# Patient Record
Sex: Female | Born: 1965 | Race: Black or African American | Hispanic: No | Marital: Single | Smoking: Never smoker
Health system: Southern US, Community
[De-identification: ages and names within clinical notes are randomized; demographics above are authoritative.]

## PROBLEM LIST (undated history)

## (undated) DIAGNOSIS — D649 Anemia, unspecified: Secondary | ICD-10-CM

## (undated) DIAGNOSIS — F32A Depression, unspecified: Secondary | ICD-10-CM

## (undated) DIAGNOSIS — K5792 Diverticulitis of intestine, part unspecified, without perforation or abscess without bleeding: Secondary | ICD-10-CM

## (undated) DIAGNOSIS — I10 Essential (primary) hypertension: Secondary | ICD-10-CM

## (undated) DIAGNOSIS — T8859XA Other complications of anesthesia, initial encounter: Secondary | ICD-10-CM

## (undated) DIAGNOSIS — E119 Type 2 diabetes mellitus without complications: Secondary | ICD-10-CM

## (undated) HISTORY — PX: HYSTEROSCOPY WITH D & C: SHX1775

---

## 2006-01-03 ENCOUNTER — Emergency Department: Payer: Self-pay | Admitting: Emergency Medicine

## 2006-07-15 ENCOUNTER — Ambulatory Visit: Payer: Self-pay

## 2006-08-03 ENCOUNTER — Ambulatory Visit: Payer: Self-pay

## 2006-11-27 ENCOUNTER — Ambulatory Visit: Payer: Self-pay | Admitting: Certified Nurse Midwife

## 2006-12-03 ENCOUNTER — Ambulatory Visit: Payer: Self-pay | Admitting: Certified Nurse Midwife

## 2007-10-22 ENCOUNTER — Ambulatory Visit: Payer: Self-pay | Admitting: Family Medicine

## 2010-12-18 ENCOUNTER — Ambulatory Visit: Payer: Self-pay | Admitting: Family Medicine

## 2011-10-01 ENCOUNTER — Ambulatory Visit: Payer: Self-pay | Admitting: Family Medicine

## 2012-01-15 ENCOUNTER — Emergency Department: Payer: Self-pay | Admitting: Emergency Medicine

## 2012-01-15 LAB — URINALYSIS, COMPLETE
Bacteria: NONE SEEN
Bilirubin,UR: NEGATIVE
Blood: NEGATIVE
Glucose,UR: NEGATIVE mg/dL (ref 0–75)
Ketone: NEGATIVE
Nitrite: NEGATIVE
Ph: 5 (ref 4.5–8.0)
Protein: NEGATIVE
RBC,UR: <1 /HPF (ref 0–5)
Specific Gravity: 1.019 (ref 1.003–1.030)
Squamous Epithelial: 2
WBC UR: 1 /HPF (ref 0–5)

## 2012-01-15 LAB — PREGNANCY, URINE: Pregnancy Test, Urine: NEGATIVE m[IU]/mL

## 2012-01-15 LAB — WET PREP, GENITAL

## 2013-11-14 ENCOUNTER — Ambulatory Visit: Payer: Self-pay | Admitting: Internal Medicine

## 2013-12-14 ENCOUNTER — Ambulatory Visit: Payer: Self-pay | Admitting: Family Medicine

## 2013-12-20 ENCOUNTER — Ambulatory Visit: Payer: Self-pay | Admitting: Family Medicine

## 2014-01-02 ENCOUNTER — Ambulatory Visit: Payer: Self-pay | Admitting: Family Medicine

## 2014-02-01 ENCOUNTER — Ambulatory Visit: Payer: Self-pay | Admitting: Family Medicine

## 2014-06-06 NOTE — Patient Outreach (Deleted)
   06/06/2014  [place] [address]  Dear Anne Fu, Kenney Houseman,  My records indicate that your last Link to Wellness appointment was on 05/03/14.  Per program requirements you must meet at least every three months with a member of the care team unless other arrangements have been made with your chronic care coordinator.  In order to avoid cancellation of programs and benefits including reduced co-pays on qualifying medications and supplies, please call [phone] by June 21, 2014 to schedule an appointment.    Once cancellation and termination of your Link To Wellness Program benefits has occurred, you must wait three (3) months before you are eligible for renewal.  If you feel that you have received this letter in error, please contact me at [phone] or e-mail at [email or web address].  Sincerely,   [facility] [place], [place], [place]  [address] Phone:  [phone] - Fax: [phone]

## 2014-06-06 NOTE — Patient Outreach (Signed)
   06/06/2014  [place] [address]  Dear Anne Fu, Kenney Houseman,  My records indicate that your last Link to Wellness appointment was on 05/03/14.  Per program requirements you must meet at least every three months with a member of the care team unless other arrangements have been made with your chronic care coordinator.  In order to avoid cancellation of programs and benefits including reduced co-pays on qualifying medications and supplies, please call [phone] by June 21, 2014 to schedule an appointment.    Once cancellation and termination of your Link To Wellness Program benefits has occurred, you must wait three (3) months before you are eligible for renewal.  If you feel that you have received this letter in error, please contact me at [phone] or e-mail at [email or web address].  Sincerely,   [facility] [place], [place], [place]  [address] Phone:  [phone] - Fax: [phone]

## 2015-06-20 ENCOUNTER — Encounter: Payer: Self-pay | Admitting: *Deleted

## 2015-06-20 ENCOUNTER — Ambulatory Visit (INDEPENDENT_AMBULATORY_CARE_PROVIDER_SITE_OTHER): Payer: 59

## 2015-06-20 ENCOUNTER — Ambulatory Visit
Admission: EM | Admit: 2015-06-20 | Discharge: 2015-06-20 | Disposition: A | Discharge status: Home / Self Care | Payer: 59 | Attending: Family Medicine | Admitting: Family Medicine

## 2015-06-20 DIAGNOSIS — S161XXA Strain of muscle, fascia and tendon at neck level, initial encounter: Secondary | ICD-10-CM | POA: Diagnosis not present

## 2015-06-20 DIAGNOSIS — M25512 Pain in left shoulder: Secondary | ICD-10-CM | POA: Diagnosis not present

## 2015-06-20 MED ORDER — METAXALONE 800 MG PO TABS: 800.0000 mg | ORAL_TABLET | Freq: Three times a day (TID) | ORAL | Status: DC

## 2015-06-20 MED ORDER — NAPROXEN 500 MG PO TABS: 500.0000 mg | ORAL_TABLET | Freq: Two times a day (BID) | ORAL | Status: DC
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
## 2015-06-20 NOTE — Discharge Instructions (Signed)
Cervical Sprain  A cervical sprain is an injury in the neck in which the strong, fibrous tissues (ligaments) that connect your neck bones stretch or tear. Cervical sprains can range from mild to severe. Severe cervical sprains can cause the neck vertebrae to be unstable. This can lead to damage of the spinal cord and can result in serious nervous system problems. The amount of time it takes for a cervical sprain to get better depends on the cause and extent of the injury. Most cervical sprains heal in 1 to 3 weeks.  CAUSES   Severe cervical sprains may be caused by:    Contact sport injuries (such as from football, rugby, wrestling, hockey, auto racing, gymnastics, diving, martial arts, or boxing).    Motor vehicle collisions.    Whiplash injuries. This is an injury from a sudden forward and backward whipping movement of the head and neck.   Falls.   Mild cervical sprains may be caused by:    Being in an awkward position, such as while cradling a telephone between your ear and shoulder.    Sitting in a chair that does not offer proper support.    Working at a poorly designed computer station.    Looking up or down for long periods of time.   SYMPTOMS    Pain, soreness, stiffness, or a burning sensation in the front, back, or sides of the neck. This discomfort may develop immediately after the injury or slowly, 24 hours or more after the injury.    Pain or tenderness directly in the middle of the back of the neck.    Shoulder or upper back pain.    Limited ability to move the neck.    Headache.    Dizziness.    Weakness, numbness, or tingling in the hands or arms.    Muscle spasms.    Difficulty swallowing or chewing.    Tenderness and swelling of the neck.   DIAGNOSIS   Most of the time your health care provider can diagnose a cervical sprain by taking your history and doing a physical exam. Your health care provider will ask about previous neck injuries and any known neck  problems, such as arthritis in the neck. X-rays may be taken to find out if there are any other problems, such as with the bones of the neck. Other tests, such as a CT scan or MRI, may also be needed.   TREATMENT   Treatment depends on the severity of the cervical sprain. Mild sprains can be treated with rest, keeping the neck in place (immobilization), and pain medicines. Severe cervical sprains are immediately immobilized. Further treatment is done to help with pain, muscle spasms, and other symptoms and may include:   Medicines, such as pain relievers, numbing medicines, or muscle relaxants.    Physical therapy. This may involve stretching exercises, strengthening exercises, and posture training. Exercises and improved posture can help stabilize the neck, strengthen muscles, and help stop symptoms from returning.   HOME CARE INSTRUCTIONS    Put ice on the injured area.     Put ice in a plastic bag.     Place a towel between your skin and the bag.     Leave the ice on for 15-20 minutes, 3-4 times a day.    If your injury was severe, you may have been given a cervical collar to wear. A cervical collar is a two-piece collar designed to keep your neck from moving while it heals.      Do not remove the collar unless instructed by your health care provider.    If you have long hair, keep it outside of the collar.    Ask your health care provider before making any adjustments to your collar. Minor adjustments may be required over time to improve comfort and reduce pressure on your chin or on the back of your head.    Ifyou are allowed to remove the collar for cleaning or bathing, follow your health care provider's instructions on how to do so safely.    Keep your collar clean by wiping it with mild soap and water and drying it completely. If the collar you have been given includes removable pads, remove them every 1-2 days and hand wash them with soap and water. Allow them to air dry. They should be completely  dry before you wear them in the collar.    If you are allowed to remove the collar for cleaning and bathing, wash and dry the skin of your neck. Check your skin for irritation or sores. If you see any, tell your health care provider.    Do not drive while wearing the collar.    Only take over-the-counter or prescription medicines for pain, discomfort, or fever as directed by your health care provider.    Keep all follow-up appointments as directed by your health care provider.    Keep all physical therapy appointments as directed by your health care provider.    Make any needed adjustments to your workstation to promote good posture.    Avoid positions and activities that make your symptoms worse.    Warm up and stretch before being active to help prevent problems.   SEEK MEDICAL CARE IF:    Your pain is not controlled with medicine.    You are unable to decrease your pain medicine over time as planned.    Your activity level is not improving as expected.   SEEK IMMEDIATE MEDICAL CARE IF:    You develop any bleeding.   You develop stomach upset.   You have signs of an allergic reaction to your medicine.    Your symptoms get worse.    You develop new, unexplained symptoms.    You have numbness, tingling, weakness, or paralysis in any part of your body.   MAKE SURE YOU:    Understand these instructions.   Will watch your condition.   Will get help right away if you are not doing well or get worse.     This information is not intended to replace advice given to you by your health care provider. Make sure you discuss any questions you have with your health care provider.     Document Released: 12/16/2006 Document Revised: 02/23/2013 Document Reviewed: 08/26/2012  Elsevier Interactive Patient Education 2016 [employer]
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
## 2017-01-11 ENCOUNTER — Encounter: Payer: Self-pay | Admitting: Gynecology

## 2017-01-11 ENCOUNTER — Ambulatory Visit
Admission: EM | Admit: 2017-01-11 | Discharge: 2017-01-11 | Disposition: A | Discharge status: Home / Self Care | Payer: Medicaid Other | Attending: Family Medicine | Admitting: Family Medicine

## 2017-01-11 ENCOUNTER — Other Ambulatory Visit: Payer: Self-pay

## 2017-01-11 DIAGNOSIS — H5712 Ocular pain, left eye: Secondary | ICD-10-CM

## 2017-01-11 DIAGNOSIS — R51 Headache: Secondary | ICD-10-CM

## 2017-01-11 DIAGNOSIS — M545 Low back pain: Secondary | ICD-10-CM

## 2017-01-11 MED ORDER — MELOXICAM 15 MG PO TABS: 15.0000 mg | ORAL_TABLET | Freq: Every day | ORAL | 0 refills | Status: DC | PRN

## 2017-01-11 MED ORDER — CYCLOBENZAPRINE HCL 10 MG PO TABS: 10.0000 mg | ORAL_TABLET | Freq: Three times a day (TID) | ORAL | 0 refills | Status: DC | PRN

## 2017-01-11 NOTE — Discharge Instructions (Signed)
Medications as prescribed. ° °Rest. ° °Take care ° °Dr. Marrion Finan  °
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
## 2017-03-29 ENCOUNTER — Other Ambulatory Visit: Payer: Self-pay

## 2017-03-29 ENCOUNTER — Ambulatory Visit
Admission: EM | Admit: 2017-03-29 | Discharge: 2017-03-29 | Disposition: A | Discharge status: Home / Self Care | Payer: BLUE CROSS/BLUE SHIELD | Attending: Family Medicine | Admitting: Family Medicine

## 2017-03-29 DIAGNOSIS — R1032 Left lower quadrant pain: Secondary | ICD-10-CM

## 2017-03-29 HISTORY — DX: Morbid (severe) obesity due to excess calories: E66.01

## 2017-03-29 LAB — URINALYSIS, COMPLETE (UACMP) WITH MICROSCOPIC
Bilirubin Urine: NEGATIVE
Glucose, UA: NEGATIVE mg/dL
Hgb urine dipstick: NEGATIVE
Leukocytes, UA: NEGATIVE
Nitrite: NEGATIVE
Protein, ur: NEGATIVE mg/dL
Specific Gravity, Urine: 1.025 (ref 1.005–1.030)
pH: 5.5 (ref 5.0–8.0)

## 2017-03-29 MED ORDER — CIPROFLOXACIN HCL 500 MG PO TABS: 500.0000 mg | ORAL_TABLET | Freq: Two times a day (BID) | ORAL | 0 refills | Status: DC

## 2017-03-29 MED ORDER — METRONIDAZOLE 500 MG PO TABS: 500.0000 mg | ORAL_TABLET | Freq: Three times a day (TID) | ORAL | 0 refills | Status: DC

## 2017-03-29 NOTE — Discharge Instructions (Signed)
I suspect that you have diverticulitis.  I have started you on treatment - 2 antibiotics.  If no improvement you will need labs and imaging.  Take care  Dr. Lacinda Axon

## 2017-03-29 NOTE — ED Provider Notes (Signed)
[facility]    CSN: [id number] Arrival date & time: 03/29/17  0855   History   Chief Complaint Chief Complaint  Patient presents with  . Pelvic Pain   HPI   52 year old female presents with left lower quadrant pain.  Started on Thursday.  Patient thought that this is related to gas and took some Gas-X with some improvement.  She has had a large bowel movement.  No urinary frequency, urgency, or dysuria.  However she states that her urine has been dark and "cloudy".  Patient would like her urine tested today.  Patient states she continues to have left lower quadrant abdominal pain.  Described as achy.  She has had some improvement with Gas-X.  No other relieving factors.  No known exacerbating factors.  No other complaints or concerns at this time.  Social History Social History   Tobacco Use  . Smoking status: Never Smoker  . Smokeless tobacco: Never Used  Substance Use Topics  . Alcohol use: Yes    Comment: social  . Drug use: No     Allergies   Patient has no known allergies.   Review of Systems Review of Systems  Constitutional: Negative.   Gastrointestinal: Positive for abdominal pain.  Genitourinary: Negative.    Physical Exam Triage Vital Signs ED Triage Vitals  Enc Vitals Group     BP 03/29/17 0924 (!) 144/82     Pulse Rate 03/29/17 0924 79     Resp 03/29/17 0924 18     Temp 03/29/17 0924 97.6 F (36.4 C)     Temp Source 03/29/17 0924 Oral     SpO2 03/29/17 0924 98 %     Weight 03/29/17 0926 (!) 338 lb (153.3 kg)     Height 03/29/17 0926 5\' 9"  (1.753 m)     Head Circumference --      Peak Flow --      Pain Score 03/29/17 0925 1     Pain Loc --      Pain Edu? --      Excl. in [place]? --    Updated Vital Signs BP (!) 144/82 (BP Location: Left Arm)   Pulse 79   Temp 97.6 F (36.4 C) (Oral)   Resp 18   Ht 5\' 9"  (1.753 m)   Wt (!) 338 lb (153.3 kg)   LMP 12/27/2016   SpO2 98%   BMI 49.91 kg/m      Physical Exam  Constitutional:  She is oriented to person, place, and time. She appears well-developed and well-nourished. No distress.  Cardiovascular: Normal rate and regular rhythm.  Pulmonary/Chest: Effort normal and breath sounds normal. She has no wheezes. She has no rales.  Abdominal:  Obese.  Soft, nondistended.  Exquisite tenderness to palpation in the left lower quadrant.  Neurological: She is alert and oriented to person, place, and time.  Psychiatric: She has a normal mood and affect. Her behavior is normal.  Nursing note and vitals reviewed.  UC Treatments / Results  Labs (all labs ordered are listed, but only abnormal results are displayed) Labs Reviewed  URINALYSIS, COMPLETE (UACMP) WITH MICROSCOPIC - Abnormal; Notable for the following components:      Result Value   APPearance HAZY (*)    Ketones, ur TRACE (*)    Squamous Epithelial / LPF 6-30 (*)    Bacteria, UA FEW (*)    All other components within normal limits    EKG  EKG Interpretation None  Radiology No results found.  Procedures Procedures (including critical care time)  Medications Ordered in UC Medications - No data to display   Initial Impression / Assessment and Plan / UC Course  I have reviewed the triage vital signs and the nursing notes.  Pertinent labs & imaging results that were available during my care of the patient were reviewed by me and considered in my medical decision making (see chart for details).     52 year old female presents with left lower quadrant pain.  Most likely has diverticulitis.  Treating with Cipro and Flagyl.  Final Clinical Impressions(s) / UC Diagnoses   Final diagnoses:  LLQ pain    ED Discharge Orders        Ordered    ciprofloxacin (CIPRO) 500 MG tablet  2 times daily     03/29/17 1003    metroNIDAZOLE (FLAGYL) 500 MG tablet  3 times daily     03/29/17 1003     Controlled Substance Prescriptions [place] Controlled Substance Registry consulted? Not Applicable   Coral Spikes, DO 03/29/17 1016

## 2017-03-29 NOTE — ED Triage Notes (Signed)
Pt with 2 days of left pelvic pain. "I though it was gas and took GAS X with some relief." Denies urinary sx but asking for urine test.

## 2017-04-01 ENCOUNTER — Telehealth: Payer: Self-pay | Admitting: *Deleted

## 2017-04-01 NOTE — Telephone Encounter (Signed)
Called patient, no answer, left message advising patient to follow up with PCP if symptoms persist or become worse.

## 2017-04-10 ENCOUNTER — Encounter: Payer: Self-pay | Admitting: *Deleted

## 2017-04-10 ENCOUNTER — Ambulatory Visit
Admission: EM | Admit: 2017-04-10 | Discharge: 2017-04-10 | Disposition: A | Discharge status: Home / Self Care | Payer: BLUE CROSS/BLUE SHIELD | Attending: Family Medicine | Admitting: Family Medicine

## 2017-04-10 ENCOUNTER — Other Ambulatory Visit: Payer: Self-pay

## 2017-04-10 DIAGNOSIS — R0981 Nasal congestion: Secondary | ICD-10-CM | POA: Diagnosis not present

## 2017-04-10 DIAGNOSIS — R509 Fever, unspecified: Secondary | ICD-10-CM | POA: Diagnosis not present

## 2017-04-10 DIAGNOSIS — R69 Illness, unspecified: Secondary | ICD-10-CM

## 2017-04-10 DIAGNOSIS — M791 Myalgia, unspecified site: Secondary | ICD-10-CM

## 2017-04-10 DIAGNOSIS — J111 Influenza due to unidentified influenza virus with other respiratory manifestations: Secondary | ICD-10-CM

## 2017-04-10 LAB — RAPID STREP SCREEN (MED CTR MEBANE ONLY): Streptococcus, Group A Screen (Direct): NEGATIVE

## 2017-04-10 MED ORDER — OSELTAMIVIR PHOSPHATE 75 MG PO CAPS: 75.0000 mg | ORAL_CAPSULE | Freq: Two times a day (BID) | ORAL | 0 refills | Status: DC

## 2017-04-10 NOTE — Discharge Instructions (Signed)
Tamiflu as prescribed.  Rest, fluids.  Take care  Dr. Lacinda Axon

## 2017-04-10 NOTE — ED Provider Notes (Signed)
[facility]    CSN: [id number] Arrival date & time: 04/10/17  0946  History   Chief Complaint Chief Complaint  Patient presents with  . Nasal Congestion  . Fever  . Generalized Body Aches   HPI  52 year old female presents with the above complaints.  Patient reports a 2-day history of cough, congestion, body aches, subjective fever and chills.  She has had sick contacts that she works in [employer].  She has had some relief with over-the-counter treatment.  However, she still feels poorly.  Still having significant body aches.  No known exacerbating factors.  No other reported symptoms.  No other complaints at this time.  Social History Social History   Tobacco Use  . Smoking status: Never Smoker  . Smokeless tobacco: Never Used  Substance Use Topics  . Alcohol use: Yes    Comment: social  . Drug use: No     Allergies   Patient has no known allergies.   Review of Systems Review of Systems  Constitutional: Positive for chills and fever.  HENT: Positive for congestion.   Respiratory: Positive for cough.   Musculoskeletal:       Body aches.   Physical Exam Triage Vital Signs ED Triage Vitals  Enc Vitals Group     BP 04/10/17 1000 (!) 146/72     Pulse Rate 04/10/17 1000 82     Resp 04/10/17 1000 18     Temp 04/10/17 1000 98.9 F (37.2 C)     Temp Source 04/10/17 1000 Oral     SpO2 04/10/17 1000 99 %     Weight --      Height --      Head Circumference --      Peak Flow --      Pain Score 04/10/17 1001 0     Pain Loc --      Pain Edu? --      Excl. in [place]? --    Updated Vital Signs BP (!) 146/72 (BP Location: Left Arm)   Pulse 82   Temp 98.9 F (37.2 C) (Oral)   Resp 18   SpO2 99%   Physical Exam  Constitutional: She is oriented to person, place, and time. She appears well-developed and well-nourished. No distress.  HENT:  Head: Normocephalic and atraumatic.  Mouth/Throat: Oropharynx is clear and moist.  Cardiovascular: Normal rate  and regular rhythm.  Pulmonary/Chest: Effort normal and breath sounds normal. She has no wheezes. She has no rales.  Neurological: She is alert and oriented to person, place, and time.  Psychiatric: She has a normal mood and affect. Her behavior is normal.  Nursing note and vitals reviewed.  UC Treatments / Results  Labs (all labs ordered are listed, but only abnormal results are displayed) Labs Reviewed  RAPID STREP SCREEN (NOT AT [facility])  CULTURE, GROUP A STREP [facility])    EKG  EKG Interpretation None       Radiology No results found.  Procedures Procedures (including critical care time)  Medications Ordered in UC Medications - No data to display   Initial Impression / Assessment and Plan / UC Course  I have reviewed the triage vital signs and the nursing notes.  Pertinent labs & imaging results that were available during my care of the patient were reviewed by me and considered in my medical decision making (see chart for details).     52 year old female presents with suspected influenza.  Treating with Tamiflu.  Work note given.  Final  Clinical Impressions(s) / UC Diagnoses   Final diagnoses:  Influenza-like illness    ED Discharge Orders        Ordered    oseltamivir (TAMIFLU) 75 MG capsule  Every 12 hours     04/10/17 1128     Controlled Substance Prescriptions [place] Controlled Substance Registry consulted? Not Applicable   Coral Spikes, DO 04/10/17 1249

## 2017-04-10 NOTE — ED Triage Notes (Signed)
Patient started having symptoms of cough, nasal congestion, sore throat, and body aches 2 days ago.

## 2017-04-12 LAB — CULTURE, GROUP A STREP (THRC)

## 2017-04-14 ENCOUNTER — Telehealth: Payer: Self-pay

## 2017-04-14 MED ORDER — PENICILLIN V POTASSIUM 500 MG PO TABS: 500.0000 mg | ORAL_TABLET | Freq: Two times a day (BID) | ORAL | 0 refills | Status: AC

## 2017-04-14 NOTE — Telephone Encounter (Signed)
-----   Message from Wynona Luna, MD sent at 04/13/2017  7:32 PM EST ----- Clinical staff, please let patient know that throat culture was positive for non group A Strep germ.  This is a finding of uncertain significance; not the typical 'strep throat' germ.  If the patient is having severe/persistent sore throat, and/or fever >100.5, ok to send rx for penicillin V 500mg  bid x 10d #20 no refills.   Recheck or followup with PCP for further evaluation if symptoms are not improving.  LM

## 2017-04-14 NOTE — Telephone Encounter (Signed)
Spoke with pt. Pt. Advised of all results.

## 2019-06-12 ENCOUNTER — Ambulatory Visit: Payer: Self-pay | Attending: Internal Medicine

## 2019-06-12 DIAGNOSIS — Z23 Encounter for immunization: Secondary | ICD-10-CM

## 2019-06-12 NOTE — Progress Notes (Signed)
   Covid-19 Vaccination Clinic  Name:  Irys Meiner    MRN: [id number] DOB: Aug 14, 1965  06/12/2019  Ms. Loney Loh was observed post Covid-19 immunization for 15 minutes without incident. She was provided with Vaccine Information Sheet and instruction to access the V-Safe system.   Ms. Loney Loh was instructed to call 911 with any severe reactions post vaccine: Marland Kitchen Difficulty breathing  . Swelling of face and throat  . A fast heartbeat  . A bad rash all over body  . Dizziness and weakness   Immunizations Administered    Name Date Dose VIS Date Route   Pfizer COVID-19 Vaccine 06/12/2019  9:45 AM 0.3 mL 02/12/2019 Intramuscular   Manufacturer: [place]   Lot: [id number]   [place]: [id number]

## 2019-07-07 ENCOUNTER — Ambulatory Visit: Payer: Self-pay | Attending: Internal Medicine

## 2019-07-07 DIAGNOSIS — Z23 Encounter for immunization: Secondary | ICD-10-CM

## 2019-07-07 NOTE — Progress Notes (Signed)
   Covid-19 Vaccination Clinic  Name:  Cheyenne Hernandez    MRN: [id number] DOB: Mar 01, 1966  07/07/2019  Ms. Cheyenne Hernandez was observed post Covid-19 immunization for 15 minutes without incident. She was provided with Vaccine Information Sheet and instruction to access the V-Safe system.   Ms. Cheyenne Hernandez was instructed to call 911 with any severe reactions post vaccine: Marland Kitchen Difficulty breathing  . Swelling of face and throat  . A fast heartbeat  . A bad rash all over body  . Dizziness and weakness   Immunizations Administered    Name Date Dose VIS Date Route   Pfizer COVID-19 Vaccine 07/07/2019 10:08 AM 0.3 mL 04/28/2018 Intramuscular   Manufacturer: [employer], [employer]   Lot: [id number]   [place]: [id number]

## 2020-01-12 ENCOUNTER — Other Ambulatory Visit: Payer: Self-pay | Admitting: Obstetrics and Gynecology

## 2020-01-12 ENCOUNTER — Other Ambulatory Visit: Payer: Self-pay

## 2020-01-12 ENCOUNTER — Ambulatory Visit
Admission: RE | Admit: 2020-01-12 | Discharge: 2020-01-12 | Disposition: A | Discharge status: Home / Self Care | Payer: Managed Care, Other (non HMO) | Source: Ambulatory Visit | Attending: Obstetrics and Gynecology | Admitting: Obstetrics and Gynecology

## 2020-01-12 DIAGNOSIS — Z1231 Encounter for screening mammogram for malignant neoplasm of breast: Secondary | ICD-10-CM

## 2020-01-18 ENCOUNTER — Ambulatory Visit: Payer: Managed Care, Other (non HMO) | Admitting: Physical Therapy

## 2020-02-10 ENCOUNTER — Other Ambulatory Visit: Payer: Self-pay | Admitting: Obstetrics & Gynecology

## 2020-03-02 ENCOUNTER — Encounter (HOSPITAL_COMMUNITY): Payer: Self-pay | Admitting: Urgent Care

## 2020-03-02 ENCOUNTER — Other Ambulatory Visit: Payer: Self-pay

## 2020-03-02 ENCOUNTER — Encounter
Admission: RE | Admit: 2020-03-02 | Discharge: 2020-03-02 | Disposition: A | Discharge status: Home / Self Care | Payer: Managed Care, Other (non HMO) | Source: Ambulatory Visit | Attending: Obstetrics & Gynecology | Admitting: Obstetrics & Gynecology

## 2020-03-02 HISTORY — DX: Type 2 diabetes mellitus without complications: E11.9

## 2020-03-02 HISTORY — DX: Depression, unspecified: F32.A

## 2020-03-02 HISTORY — DX: Other complications of anesthesia, initial encounter: T88.59XA

## 2020-03-02 HISTORY — DX: Essential (primary) hypertension: I10

## 2020-03-02 HISTORY — DX: Anemia, unspecified: D64.9

## 2020-03-02 NOTE — Patient Instructions (Signed)
Your procedure is scheduled on: 03/10/20 Report to [facility]. To find out your arrival time please call [phone] between 1PM - 3PM on 03/09/20.  Remember: Instructions that are not followed completely may result in serious medical risk, up to and including death, or upon the discretion of your surgeon and anesthesiologist your surgery may need to be rescheduled.     _X__ 1. Do not eat food after midnight the night before your procedure.                 No gum chewing or hard candies. You may drink clear liquids up to 2 hours                 before you are scheduled to arrive for your surgery- DO not drink clear                 liquids within 2 hours of the start of your surgery.                 Clear Liquids include:  water, apple juice without pulp, clear carbohydrate                 drink such as Clearfast or Gatorade, Black Coffee or Tea (Do not add                 anything to coffee or tea). Diabetics water only  __X__2.  On the morning of surgery brush your teeth with toothpaste and water, you                 may rinse your mouth with mouthwash if you wish.  Do not swallow any              toothpaste of mouthwash.     _X__ 3.  No Alcohol for 24 hours before or after surgery.   _X__ 4.  Do Not Smoke or use e-cigarettes For 24 Hours Prior to Your Surgery.                 Do not use any chewable tobacco products for at least 6 hours prior to                 surgery.  ____  5.  Bring all medications with you on the day of surgery if instructed.   __X__  6.  Notify your doctor if there is any change in your medical condition      (cold, fever, infections).     Do not wear jewelry, make-up, hairpins, clips or nail polish. Do not wear lotions, powders, or perfumes.  Do not shave 48 hours prior to surgery. Men may shave face and neck. Do not bring valuables to the hospital.    [facility] is not responsible for any belongings or  valuables.  Contacts, dentures/partials or body piercings may not be worn into surgery. Bring a case for your contacts, glasses or hearing aids, a denture cup will be supplied. Leave your suitcase in the car. After surgery it may be brought to your room. For patients admitted to the hospital, discharge time is determined by your treatment team.   Patients discharged the day of surgery will not be allowed to drive home.   Please read over the following fact sheets that you were given:   MRSA Information  __X__ Take these medicines the morning of surgery with A SIP OF WATER:  1. none  2.   3.   4.  5.  6.  ____ Fleet Enema (as directed)   ____ Use CHG Soap/SAGE wipes as directed  ____ Use inhalers on the day of surgery  __X__ Stop metformin/Janumet/Farxiga 2 days prior to surgery  Last dose Tuesday night, can restart after surgery  ____ Take 1/2 of usual insulin dose the night before surgery. No insulin the morning          of surgery.   ____ Stop Blood Thinners Coumadin/Plavix/Xarelto/Pleta/Pradaxa/Eliquis/Effient/Aspirin  on   Or contact your Surgeon, Cardiologist or Medical Doctor regarding  ability to stop your blood thinners  __X__ Stop Anti-inflammatories 7 days before surgery such as Advil, Ibuprofen, Motrin,  BC or Goodies Powder, Naprosyn, Naproxen, Aleve, Aspirin   You may take Tylenol  __X__ Stop all herbal supplements, fish oil or vitamin E until after surgery.  OK to continue Vitamin D  ____ Bring C-Pap to the hospital.

## 2020-03-06 ENCOUNTER — Other Ambulatory Visit: Payer: Self-pay

## 2020-03-06 ENCOUNTER — Encounter
Admission: RE | Admit: 2020-03-06 | Discharge: 2020-03-06 | Disposition: A | Discharge status: Home / Self Care | Payer: Managed Care, Other (non HMO) | Source: Ambulatory Visit | Attending: Obstetrics & Gynecology | Admitting: Obstetrics & Gynecology

## 2020-03-06 DIAGNOSIS — Z01810 Encounter for preprocedural cardiovascular examination: Secondary | ICD-10-CM | POA: Insufficient documentation

## 2020-03-08 ENCOUNTER — Other Ambulatory Visit
Admission: RE | Admit: 2020-03-08 | Discharge: 2020-03-08 | Disposition: A | Discharge status: Home / Self Care | Payer: Managed Care, Other (non HMO) | Source: Ambulatory Visit | Attending: Obstetrics & Gynecology | Admitting: Obstetrics & Gynecology

## 2020-03-08 ENCOUNTER — Other Ambulatory Visit: Payer: Self-pay

## 2020-03-08 DIAGNOSIS — Z20822 Contact with and (suspected) exposure to covid-19: Secondary | ICD-10-CM | POA: Insufficient documentation

## 2020-03-08 DIAGNOSIS — Z01812 Encounter for preprocedural laboratory examination: Secondary | ICD-10-CM | POA: Diagnosis not present

## 2020-03-08 LAB — SARS CORONAVIRUS 2 (TAT 6-24 HRS): SARS Coronavirus 2: NEGATIVE

## 2020-03-10 ENCOUNTER — Ambulatory Visit
Admission: RE | Admit: 2020-03-10 | Payer: Managed Care, Other (non HMO) | Source: Home / Self Care | Admitting: Obstetrics & Gynecology

## 2020-03-10 ENCOUNTER — Encounter: Admission: RE | Payer: Self-pay | Source: Home / Self Care

## 2020-03-10 SURGERY — DILATATION AND CURETTAGE /HYSTEROSCOPY
Anesthesia: General

## 2020-06-02 ENCOUNTER — Ambulatory Visit
Admission: EM | Admit: 2020-06-02 | Discharge: 2020-06-02 | Disposition: A | Discharge status: Home / Self Care | Payer: Managed Care, Other (non HMO)

## 2020-06-02 ENCOUNTER — Encounter: Payer: Self-pay | Admitting: Emergency Medicine

## 2020-06-02 ENCOUNTER — Emergency Department
Admission: EM | Admit: 2020-06-02 | Discharge: 2020-06-03 | Disposition: A | Discharge status: Home / Self Care | Payer: Managed Care, Other (non HMO) | Attending: Emergency Medicine | Admitting: Emergency Medicine

## 2020-06-02 ENCOUNTER — Other Ambulatory Visit: Payer: Self-pay

## 2020-06-02 DIAGNOSIS — I10 Essential (primary) hypertension: Secondary | ICD-10-CM | POA: Diagnosis not present

## 2020-06-02 DIAGNOSIS — R1011 Right upper quadrant pain: Secondary | ICD-10-CM

## 2020-06-02 DIAGNOSIS — E119 Type 2 diabetes mellitus without complications: Secondary | ICD-10-CM | POA: Insufficient documentation

## 2020-06-02 DIAGNOSIS — Z79899 Other long term (current) drug therapy: Secondary | ICD-10-CM | POA: Diagnosis not present

## 2020-06-02 DIAGNOSIS — K5792 Diverticulitis of intestine, part unspecified, without perforation or abscess without bleeding: Secondary | ICD-10-CM | POA: Insufficient documentation

## 2020-06-02 DIAGNOSIS — Z7984 Long term (current) use of oral hypoglycemic drugs: Secondary | ICD-10-CM | POA: Insufficient documentation

## 2020-06-02 DIAGNOSIS — R1031 Right lower quadrant pain: Secondary | ICD-10-CM | POA: Diagnosis not present

## 2020-06-02 DIAGNOSIS — R109 Unspecified abdominal pain: Secondary | ICD-10-CM | POA: Diagnosis present

## 2020-06-02 DIAGNOSIS — R110 Nausea: Secondary | ICD-10-CM | POA: Diagnosis not present

## 2020-06-02 DIAGNOSIS — R1033 Periumbilical pain: Secondary | ICD-10-CM

## 2020-06-02 LAB — COMPREHENSIVE METABOLIC PANEL
ALT: 14 U/L (ref 0–44)
AST: 15 U/L (ref 15–41)
Albumin: 4.3 g/dL (ref 3.5–5.0)
Alkaline Phosphatase: 60 U/L (ref 38–126)
Anion gap: 10 (ref 5–15)
BUN: 13 mg/dL (ref 6–20)
CO2: 24 mmol/L (ref 22–32)
Calcium: 9.6 mg/dL (ref 8.9–10.3)
Chloride: 106 mmol/L (ref 98–111)
Creatinine, Ser: 0.98 mg/dL (ref 0.44–1.00)
GFR, Estimated: >60 mL/min (ref >60)
Glucose, Bld: 145 mg/dL — ABNORMAL HIGH (ref 70–99)
Potassium: 3.6 mmol/L (ref 3.5–5.1)
Sodium: 140 mmol/L (ref 135–145)
Total Bilirubin: 0.7 mg/dL (ref 0.3–1.2)
Total Protein: 8.5 g/dL — ABNORMAL HIGH (ref 6.5–8.1)

## 2020-06-02 LAB — URINALYSIS, COMPLETE (UACMP) WITH MICROSCOPIC
Bilirubin Urine: NEGATIVE
Glucose, UA: NEGATIVE mg/dL
Hgb urine dipstick: NEGATIVE
Ketones, ur: NEGATIVE mg/dL
Nitrite: NEGATIVE
Protein, ur: NEGATIVE mg/dL
Specific Gravity, Urine: 1.003 — ABNORMAL LOW (ref 1.005–1.030)
Squamous Epithelial / HPF: NONE SEEN (ref 0–5)
pH: 6 (ref 5.0–8.0)

## 2020-06-02 LAB — CBC
HCT: 37.5 % (ref 36.0–46.0)
Hemoglobin: 12.1 g/dL (ref 12.0–15.0)
MCH: 25.6 pg — ABNORMAL LOW (ref 26.0–34.0)
MCHC: 32.3 g/dL (ref 30.0–36.0)
MCV: 79.4 fL — ABNORMAL LOW (ref 80.0–100.0)
Platelets: 343 10*3/uL (ref 150–400)
RBC: 4.72 MIL/uL (ref 3.87–5.11)
RDW: 14 % (ref 11.5–15.5)
WBC: 13.9 10*3/uL — ABNORMAL HIGH (ref 4.0–10.5)
nRBC: 0 % (ref 0.0–0.2)

## 2020-06-02 LAB — LIPASE, BLOOD: Lipase: 30 U/L (ref 11–51)

## 2020-06-02 NOTE — ED Provider Notes (Signed)
[facility]    CSN: [id number] Arrival date & time: 06/02/20  1941      History   Chief Complaint Chief Complaint  Patient presents with  . Abdominal Pain    RUQ and RLQ    HPI Cheyenne Hernandez is a 55 y.o. female presenting for severe sharp right upper quadrant, umbilical and right lower quadrant abdominal pain for about 12 hours.  Patient states that the pain is progressively worsening.  Pain is worse with palpation of the stomach, bending or moving and laying down.  It is associated with loss of appetite, chills, and nausea without vomiting.  She denies any constipation or diarrhea.  No fevers but she has been fatigued.  Patient reports a history of diverticulitis and thinks she might be having a diverticulitis flareup.  She also has a history of type 2 diabetes, hypertension and morbid obesity.  Patient has not taken anything for pain relief.  Patient has not had any surgical procedure involving the gallbladder or appendix in the past.  She is unsure if her symptoms today are similar to that she experienced when she had a diverticulitis flareup a few years ago.  She has no other complaints or concerns.  HPI  Past Medical History:  Diagnosis Date  . Anemia   . Complication of anesthesia    epideral allergy  . Depression   . Diabetes mellitus without complication ([place])   . Hypertension   . Morbid obesity ([place])     There are no problems to display for this patient.   Past Surgical History:  Procedure Laterality Date  . ABDOMINAL SURGERY    . DILATION AND EVACUATION      OB History   No obstetric history on file.      Home Medications    Prior to Admission medications   Medication Sig Start Date End Date Taking? Authorizing Provider  cholecalciferol (VITAMIN D3) 25 MCG (1000 UNIT) tablet Take 1,000 Units by mouth daily.   Yes [provider]  losartan (COZAAR) 25 MG tablet Take 25 mg by mouth daily.   Yes [provider]   metFORMIN (GLUCOPHAGE) 1000 MG tablet Take 1,000 mg by mouth 2 (two) times daily. 11/23/19  Yes [provider]  Multiple Vitamins-Minerals (MULTIVITAMIN WITH MINERALS) tablet Take 1 tablet by mouth daily.   Yes [provider]  OZEMPIC, 0.25 OR 0.5 MG/DOSE, 2 MG/1.5ML SOPN Inject 0.5 mg into the skin every Sunday. 01/25/20  Yes [provider]    Family History Family History  Problem Relation Age of Onset  . Healthy Mother   . Breast cancer Maternal Great-grandmother     Social History Social History   Tobacco Use  . Smoking status: Never Smoker  . Smokeless tobacco: Never Used  Vaping Use  . Vaping Use: Never used  Substance Use Topics  . Alcohol use: Yes    Comment: social  . Drug use: No     Allergies   Other   Review of Systems Review of Systems  Constitutional: Positive for appetite change, chills and fatigue. Negative for diaphoresis and fever.  HENT: Negative for congestion, rhinorrhea and sore throat.   Respiratory: Negative for cough and shortness of breath.   Cardiovascular: Negative for chest pain.  Gastrointestinal: Positive for abdominal pain and nausea. Negative for constipation, diarrhea and vomiting.  Genitourinary: Negative for difficulty urinating, dysuria, flank pain and pelvic pain.  Musculoskeletal: Negative for arthralgias and myalgias.  Skin: Negative for rash.  Neurological: Negative for weakness and headaches.     Physical Exam Triage Vital Signs ED Triage Vitals  Enc Vitals Group     BP 06/02/20 1954 (!) 146/79     Pulse Rate 06/02/20 1954 (!) 115     Resp 06/02/20 1954 14     Temp 06/02/20 1954 98.6 F (37 C)     Temp Source 06/02/20 1954 Oral     SpO2 06/02/20 1954 97 %     Weight 06/02/20 1952 300 lb (136.1 kg)     Height 06/02/20 1952 5\' 9"  (1.753 m)     Head Circumference --      Peak Flow --      Pain Score 06/02/20 1951 7     Pain Loc --      Pain Edu? --      Excl. in [place]? --    No data  found.  Updated Vital Signs BP (!) 146/79 (BP Location: Left Arm)   Pulse (!) 115   Temp 98.6 F (37 C) (Oral)   Resp 14   Ht 5\' 9"  (1.753 m)   Wt 300 lb (136.1 kg)   LMP 12/27/2016   SpO2 97%   BMI 44.30 kg/m       Physical Exam Vitals and nursing note reviewed.  Constitutional:      General: She is not in acute distress.    Appearance: Normal appearance. She is ill-appearing (appears to be in significant pain, especially with position changes and abdominal exam.). She is not toxic-appearing.  HENT:     Head: Normocephalic and atraumatic.     Nose: Nose normal.     Mouth/Throat:     Mouth: Mucous membranes are moist.     Pharynx: Oropharynx is clear.  Eyes:     General: No scleral icterus.       Right eye: No discharge.        Left eye: No discharge.     Conjunctiva/sclera: Conjunctivae normal.  Cardiovascular:     Rate and Rhythm: Regular rhythm. Tachycardia present.     Heart sounds: Normal heart sounds.  Pulmonary:     Effort: Pulmonary effort is normal. No respiratory distress.     Breath sounds: Normal breath sounds.  Abdominal:     Tenderness: There is abdominal tenderness in the right upper quadrant, right lower quadrant, epigastric area and periumbilical area. There is guarding and rebound. Positive signs include Murphy's sign and McBurney's sign.  Musculoskeletal:     Cervical back: Neck supple.  Skin:    General: Skin is dry.  Neurological:     General: No focal deficit present.     Mental Status: She is alert. Mental status is at baseline.     Motor: No weakness.     Gait: Gait normal.  Psychiatric:        Mood and Affect: Mood normal.        Behavior: Behavior normal.        Thought Content: Thought content normal.      UC Treatments / Results  Labs (all labs ordered are listed, but only abnormal results are displayed) Labs Reviewed - No data to display  EKG   Radiology No results found.  Procedures Procedures (including critical care  time)  Medications Ordered in UC Medications - No data to display  Initial Impression / Assessment and Plan / UC Course  I have reviewed the triage vital signs and the nursing notes.  Pertinent labs & imaging  results that were available during my care of the patient were reviewed by me and considered in my medical decision making (see chart for details).   55 year old diabetic, morbidly obese female presenting for severe abdominal pain of the right upper quadrant, right lower quadrant, periumbilical region and epigastric area with associated nausea, chills and decreased appetite.  Blood pressure is elevated at 146/79 and pulse elevated at 115.  Patient is obviously in pain.  Abdominal exam significant for diffuse tenderness to palpation of the right upper quadrant, right lower quadrant, periumbilical region and epigastric area.  She appears to have the most pain of the periumbilical region and right lower quadrant.  She does have rebound and guarding.  She swats my hand away during parts of the exam.  Her chest is clear to auscultation and heart is tachycardic rate.  Advised patient that I am concerned about possible cholecystitis/cholelithiasis/cholangitis and appendicitis.  Advised her that we do not have CT imaging today to rule those possibilities out and that they are medical emergencies.  Advised her that she needs work-up in the emergency department at this time.  I did offer EMS transport, but she declined and stated that her daughter will take her.  Patient leaving in stable condition at this time on her way to [facility] for work-up.   Final Clinical Impressions(s) / UC Diagnoses   Final diagnoses:  Right lower quadrant abdominal pain  Periumbilical abdominal pain  Right upper quadrant abdominal pain  Nausea without vomiting   Discharge Instructions   None    ED Prescriptions    None     PDMP not reviewed this encounter.   Danton Clap, PA-C 06/02/20 2021

## 2020-06-02 NOTE — ED Triage Notes (Signed)
Patient c/o mid abdominal pain that radiates to RLQ where the worse pain is. Patient states the pain began at 0800 today. Patient was able to eat this morning, but became nauseous later. Patient has a history of diverticulitis. Patient was sent here from Urgent Care.

## 2020-06-02 NOTE — ED Triage Notes (Signed)
Patient c/o RUQ and RLQ pain that started around 8am this morning. Patient reports some nausea.  Patient reports history or Diverticulitis.  Patient reports some SOB that started later today with chills.

## 2020-06-02 NOTE — Discharge Instructions (Addendum)

## 2020-06-02 NOTE — ED Notes (Signed)
Patient is being discharged from the Urgent Care and sent to the [facility] Emergency Department via private vehicle . Per Laurene Footman, PA, patient is in need of higher level of care due to need for ct scan. Patient is aware and verbalizes understanding of plan of care.  Vitals:   06/02/20 1954  BP: (!) 146/79  Pulse: (!) 115  Resp: 14  Temp: 98.6 F (37 C)  SpO2: 97%

## 2020-06-03 ENCOUNTER — Emergency Department: Payer: Managed Care, Other (non HMO)

## 2020-06-03 MED ORDER — ONDANSETRON 8 MG PO TBDP: 8.0000 mg | ORAL_TABLET | Freq: Three times a day (TID) | ORAL | 0 refills | Status: AC | PRN

## 2020-06-03 MED ORDER — TRAMADOL HCL 50 MG PO TABS: 50.0000 mg | ORAL_TABLET | Freq: Four times a day (QID) | ORAL | 0 refills | Status: AC | PRN

## 2020-06-03 MED ORDER — IOHEXOL 300 MG/ML  SOLN
100.0000 mL | Freq: Once | INTRAMUSCULAR | Status: AC | PRN
  Administered 2020-06-03: 100 mL via INTRAVENOUS

## 2020-06-03 MED ORDER — AMOXICILLIN-POT CLAVULANATE 875-125 MG PO TABS: 1.0000 | ORAL_TABLET | Freq: Three times a day (TID) | ORAL | 0 refills | Status: AC

## 2020-06-03 MED ORDER — AMOXICILLIN-POT CLAVULANATE 875-125 MG PO TABS
1.0000 | ORAL_TABLET | Freq: Once | ORAL | Status: AC
  Administered 2020-06-03: 1 via ORAL
  Filled 2020-06-03: qty 1

## 2020-06-03 MED ORDER — IOHEXOL 9 MG/ML PO SOLN: 500.0000 mL | ORAL | Status: DC

## 2020-06-03 NOTE — Discharge Instructions (Signed)
The antibiotic as prescribed and finish the full course.  You may take the tramadol as needed for pain and Zofran as needed for nausea.  Return to the ER for new, worsening, or persistent severe pain, vomiting, inability to take the medication, fever, weakness, severe diarrhea, blood in the stool, or any other new or worsening symptoms that concern you.

## 2020-06-03 NOTE — ED Provider Notes (Signed)
[facility] Emergency Department Provider Note ____________________________________________   Event Date/Time   First MD Initiated Contact with Patient 06/02/20 2355     (approximate)  I have reviewed the triage vital signs and the nursing notes.   HISTORY  Chief Complaint Abdominal Pain    HPI GENECIS VELEY is a 55 y.o. female with PMH as noted below including a prior history of diverticulitis few years ago who presents with abdominal pain since 8 AM today, persistent course, some is coming in more severe waves.  The patient states that the pain started in the epigastric and periumbilical region and has now moved more to the right lower quadrant and right mid abdomen.  She reports some associated nausea but no vomiting.  She has not had any diarrhea.  She states that it feels somewhat similar to when she had diverticulitis although the pain was more on the left.  She was seen at urgent care and referred to the ED for further work-up.  Past Medical History:  Diagnosis Date  . Anemia   . Complication of anesthesia    epideral allergy  . Depression   . Diabetes mellitus without complication ([place])   . Hypertension   . Morbid obesity ([place])     There are no problems to display for this patient.   Past Surgical History:  Procedure Laterality Date  . ABDOMINAL SURGERY    . DILATION AND EVACUATION      Prior to Admission medications   Medication Sig Start Date End Date Taking? Authorizing Provider  amoxicillin-clavulanate (AUGMENTIN) 875-125 MG tablet Take 1 tablet by mouth every 8 (eight) hours for 7 days. 06/03/20 06/10/20 Yes Arta Silence, MD  ondansetron (ZOFRAN ODT) 8 MG disintegrating tablet Take 1 tablet (8 mg total) by mouth every 8 (eight) hours as needed for nausea or vomiting. 06/03/20  Yes Arta Silence, MD  traMADol (ULTRAM) 50 MG tablet Take 1 tablet (50 mg total) by mouth every 6 (six) hours as needed for up to 5  days. 06/03/20 06/08/20 Yes Arta Silence, MD  cholecalciferol (VITAMIN D3) 25 MCG (1000 UNIT) tablet Take 1,000 Units by mouth daily.    [provider]  losartan (COZAAR) 25 MG tablet Take 25 mg by mouth daily.    [provider]  metFORMIN (GLUCOPHAGE) 1000 MG tablet Take 1,000 mg by mouth 2 (two) times daily. 11/23/19   [provider]  Multiple Vitamins-Minerals (MULTIVITAMIN WITH MINERALS) tablet Take 1 tablet by mouth daily.    [provider]  OZEMPIC, 0.25 OR 0.5 MG/DOSE, 2 MG/1.5ML SOPN Inject 0.5 mg into the skin every Sunday. 01/25/20   [provider]    Allergies Other  Family History  Problem Relation Age of Onset  . Healthy Mother   . Breast cancer Maternal Great-grandmother     Social History Social History   Tobacco Use  . Smoking status: Never Smoker  . Smokeless tobacco: Never Used  Vaping Use  . Vaping Use: Never used  Substance Use Topics  . Alcohol use: Yes    Comment: social  . Drug use: No    Review of Systems  Constitutional: No fever/chills. Eyes: No visual changes. ENT: No sore throat. Cardiovascular: Denies chest pain. Respiratory: Denies shortness of breath. Gastrointestinal: Positive for nausea Genitourinary: Negative for dysuria or hematuria.  Musculoskeletal: Negative for back pain. Skin: Negative for rash. Neurological: Negative for headache   ____________________________________________   PHYSICAL EXAM:  VITAL SIGNS: ED Triage Vitals  Enc Vitals Group     BP 06/02/20 2100 (!) 156/91     Pulse Rate 06/02/20 2100 (!) 107     Resp 06/02/20 2100 20     Temp 06/02/20 2100 99.4 F (37.4 C)     Temp Source 06/02/20 2100 Oral     SpO2 06/02/20 2100 99 %     Weight 06/02/20 2100 (!) 350 lb (158.8 kg)     Height 06/02/20 2100 5\' 9"  (1.753 m)     Head Circumference --      Peak Flow --      Pain Score 06/02/20 2113 6     Pain Loc --      Pain Edu? --      Excl. in [place]? --      Constitutional: Alert and oriented. Well appearing and in no acute distress. Eyes: Conjunctivae are normal.  No scleral icterus. Head: Atraumatic. Nose: No congestion/rhinnorhea. Mouth/Throat: Mucous membranes are moist.   Neck: Normal range of motion.  Cardiovascular: Normal rate, regular rhythm. Good peripheral circulation. Respiratory: Normal respiratory effort.  No retractions. Gastrointestinal: Soft with moderate right lower quadrant tenderness no distention.  Genitourinary: No flank tenderness. Musculoskeletal: Extremities warm and well perfused.  Neurologic:  Normal speech and language. No gross focal neurologic deficits are appreciated.  Skin:  Skin is warm and dry. No rash noted. Psychiatric: Mood and affect are normal. Speech and behavior are normal.  ____________________________________________   LABS (all labs ordered are listed, but only abnormal results are displayed)  Labs Reviewed  COMPREHENSIVE METABOLIC PANEL - Abnormal; Notable for the following components:      Result Value   Glucose, Bld 145 (*)    Total Protein 8.5 (*)    All other components within normal limits  CBC - Abnormal; Notable for the following components:   WBC 13.9 (*)    MCV 79.4 (*)    MCH 25.6 (*)    All other components within normal limits  URINALYSIS, COMPLETE (UACMP) WITH MICROSCOPIC - Abnormal; Notable for the following components:   Color, Urine STRAW (*)    APPearance CLEAR (*)    Specific Gravity, Urine 1.003 (*)    Leukocytes,Ua SMALL (*)    Bacteria, UA RARE (*)    All other components within normal limits  LIPASE, BLOOD  POC URINE PREG, ED   ____________________________________________  EKG   ____________________________________________  RADIOLOGY  CT abdomen/pelvis: Diverticulitis transverse colon  ____________________________________________   PROCEDURES  Procedure(s) performed: No  Procedures  Critical Care performed:  No ____________________________________________   INITIAL IMPRESSION / ASSESSMENT AND PLAN / ED COURSE  Pertinent labs & imaging results that were available during my care of the patient were reviewed by me and considered in my medical decision making (see chart for details).  55 year old female with PMH as noted above including a prior history of diverticulitis presents with primarily right lower quadrant pain since this morning associated with nausea.  She was seen at urgent care earlier this evening and referred to the ED.  On exam, the patient is overall well-appearing.  She has a slightly elevated temperature, borderline tachycardia, and hypertension.  The abdomen is soft with moderate right lower quadrant tenderness but no peritoneal signs.  Initial lab work-up reveals leukocytosis.  Differential includes appendicitis, diverticulitis, colitis, or less likely possible hepatobiliary cause given the normal LFTs and lipase.  The patient does not have any significant urinary symptoms so I have a low suspicion for UTI/pyonephritis.  We will obtain a  CT for further evaluation.  ----------------------------------------- 1:51 AM on 06/03/2020 -----------------------------------------  CT shows findings compatible with diverticulitis in the proximal to mid transverse colon which corresponds to the location of the patient's pain.  Given that she has relatively controlled pain and has not needed any analgesia in the ED, is tolerating p.o., is afebrile, and overall well-appearing, she is appropriate for outpatient treatment.  She is stable for discharge at this time and feels well to go home.  I have prescribed Augmentin as well as tramadol and Zofran for symptomatic treatment.  I counseled the patient on the results of the work-up.  Return precautions given, she expressed understanding.  ____________________________________________   FINAL CLINICAL IMPRESSION(S) / ED DIAGNOSES  Final diagnoses:   Diverticulitis      NEW MEDICATIONS STARTED DURING THIS VISIT:  New Prescriptions   AMOXICILLIN-CLAVULANATE (AUGMENTIN) 875-125 MG TABLET    Take 1 tablet by mouth every 8 (eight) hours for 7 days.   ONDANSETRON (ZOFRAN ODT) 8 MG DISINTEGRATING TABLET    Take 1 tablet (8 mg total) by mouth every 8 (eight) hours as needed for nausea or vomiting.   TRAMADOL (ULTRAM) 50 MG TABLET    Take 1 tablet (50 mg total) by mouth every 6 (six) hours as needed for up to 5 days.     Note:  This document was prepared using Dragon voice recognition software and may include unintentional dictation errors.    Arta Silence, MD 06/03/20 [phone]

## 2020-06-03 NOTE — ED Notes (Signed)
Pt drinking po contrast for ct

## 2020-07-05 ENCOUNTER — Other Ambulatory Visit: Admission: RE | Admit: 2020-07-05 | Payer: Managed Care, Other (non HMO) | Source: Ambulatory Visit

## 2020-07-07 ENCOUNTER — Ambulatory Visit: Payer: Managed Care, Other (non HMO) | Admitting: Anesthesiology

## 2020-07-07 ENCOUNTER — Ambulatory Visit
Admission: RE | Admit: 2020-07-07 | Discharge: 2020-07-07 | Disposition: A | Discharge status: Home / Self Care | Payer: Managed Care, Other (non HMO) | Attending: Gastroenterology | Admitting: Gastroenterology

## 2020-07-07 ENCOUNTER — Other Ambulatory Visit: Payer: Self-pay

## 2020-07-07 ENCOUNTER — Encounter
Admission: RE | Disposition: A | Discharge status: Home / Self Care | Payer: Self-pay | Source: Home / Self Care | Attending: Gastroenterology

## 2020-07-07 ENCOUNTER — Encounter: Payer: Self-pay | Admitting: *Deleted

## 2020-07-07 DIAGNOSIS — I10 Essential (primary) hypertension: Secondary | ICD-10-CM | POA: Diagnosis not present

## 2020-07-07 DIAGNOSIS — D123 Benign neoplasm of transverse colon: Secondary | ICD-10-CM | POA: Insufficient documentation

## 2020-07-07 DIAGNOSIS — Z7984 Long term (current) use of oral hypoglycemic drugs: Secondary | ICD-10-CM | POA: Insufficient documentation

## 2020-07-07 DIAGNOSIS — E119 Type 2 diabetes mellitus without complications: Secondary | ICD-10-CM | POA: Insufficient documentation

## 2020-07-07 DIAGNOSIS — Z8371 Family history of colonic polyps: Secondary | ICD-10-CM | POA: Diagnosis not present

## 2020-07-07 DIAGNOSIS — Z79899 Other long term (current) drug therapy: Secondary | ICD-10-CM | POA: Diagnosis not present

## 2020-07-07 DIAGNOSIS — K5730 Diverticulosis of large intestine without perforation or abscess without bleeding: Secondary | ICD-10-CM | POA: Insufficient documentation

## 2020-07-07 DIAGNOSIS — K640 First degree hemorrhoids: Secondary | ICD-10-CM | POA: Diagnosis not present

## 2020-07-07 DIAGNOSIS — Z1211 Encounter for screening for malignant neoplasm of colon: Secondary | ICD-10-CM | POA: Insufficient documentation

## 2020-07-07 HISTORY — DX: Diverticulitis of intestine, part unspecified, without perforation or abscess without bleeding: K57.92

## 2020-07-07 HISTORY — PX: COLONOSCOPY: SHX5424

## 2020-07-07 LAB — GLUCOSE, CAPILLARY: Glucose-Capillary: 122 mg/dL — ABNORMAL HIGH (ref 70–99)

## 2020-07-07 SURGERY — COLONOSCOPY
Anesthesia: General

## 2020-07-07 MED ORDER — PROPOFOL 500 MG/50ML IV EMUL
INTRAVENOUS | Status: AC
  Filled 2020-07-07: qty 50

## 2020-07-07 MED ORDER — PROPOFOL 10 MG/ML IV BOLUS
INTRAVENOUS | Status: DC | PRN
  Administered 2020-07-07 (×2): 20 mg via INTRAVENOUS
  Administered 2020-07-07: 50 mg via INTRAVENOUS
  Administered 2020-07-07: 20 mg via INTRAVENOUS
  Administered 2020-07-07: 10 mg via INTRAVENOUS

## 2020-07-07 MED ORDER — PROPOFOL 500 MG/50ML IV EMUL
INTRAVENOUS | Status: DC | PRN
  Administered 2020-07-07: 180 ug/kg/min via INTRAVENOUS

## 2020-07-07 MED ORDER — SODIUM CHLORIDE 0.9 % IV SOLN: INTRAVENOUS | Status: DC

## 2020-07-07 NOTE — Anesthesia Preprocedure Evaluation (Signed)
Anesthesia Evaluation  Patient identified by MRN, date of birth, ID band Patient awake    Reviewed: Allergy & Precautions, H&P , NPO status , Patient's Chart, lab work & pertinent test results, reviewed documented beta blocker date and time   History of Anesthesia Complications (+) history of anesthetic complications  Airway Mallampati: II   Neck ROM: full    Dental  (+) Poor Dentition   Pulmonary neg pulmonary ROS,    Pulmonary exam normal        Cardiovascular Exercise Tolerance: Good hypertension, On Medications negative cardio ROS Normal cardiovascular exam Rhythm:regular Rate:Normal     Neuro/Psych PSYCHIATRIC DISORDERS Depression negative neurological ROS     GI/Hepatic negative GI ROS, Neg liver ROS,   Endo/Other  negative endocrine ROSdiabetes  Renal/GU negative Renal ROS  negative genitourinary   Musculoskeletal   Abdominal   Peds  Hematology  (+) Blood dyscrasia, anemia ,   Anesthesia Other Findings Past Medical History: No date: Anemia No date: Complication of anesthesia     Comment:  epideral allergy No date: Depression No date: Diabetes mellitus without complication (HCC) No date: Diverticulitis No date: Hypertension No date: Morbid obesity ([place]) Past Surgical History: No date: HYSTEROSCOPY WITH D & C BMI    Body Mass Index: 47.26 kg/m     Reproductive/Obstetrics negative OB ROS                             Anesthesia Physical Anesthesia Plan  ASA: III  Anesthesia Plan: General   Post-op Pain Management:    Induction:   PONV Risk Score and Plan:   Airway Management Planned:   Additional Equipment:   Intra-op Plan:   Post-operative Plan:   Informed Consent: I have reviewed the patients History and Physical, chart, labs and discussed the procedure including the risks, benefits and alternatives for the proposed anesthesia with the patient or  authorized representative who has indicated his/her understanding and acceptance.     Dental Advisory Given  Plan Discussed with: CRNA  Anesthesia Plan Comments:         Anesthesia Quick Evaluation

## 2020-07-07 NOTE — Transfer of Care (Signed)
Immediate Anesthesia Transfer of Care Note  Patient: [name]  Procedure(s) Performed: COLONOSCOPY (N/A )  Patient Location: PACU  Anesthesia Type:General  Level of Consciousness: awake, alert  and oriented  Airway & Oxygen Therapy: Patient Spontanous Breathing and Patient connected to nasal cannula oxygen  Post-op Assessment: Report given to RN and Post -op Vital signs reviewed and stable  Post vital signs: Reviewed and stable  Last Vitals:  Vitals Value Taken Time  BP    Temp    Pulse    Resp    SpO2      Last Pain:  Vitals:   07/07/20 1148  TempSrc: Temporal  PainSc: 0-No pain         Complications: No complications documented.

## 2020-07-07 NOTE — Anesthesia Procedure Notes (Signed)
Date/Time: 07/07/2020 11:34 AM Performed by: Allean Found, CRNA Pre-anesthesia Checklist: Patient identified, Emergency Drugs available, Suction available, Patient being monitored and Timeout performed Oxygen Delivery Method: Nasal cannula Ventilation: Nasal airway inserted- appropriate to patient size Comments: Lubricated 7.0 right nare

## 2020-07-07 NOTE — Interval H&P Note (Signed)
History and Physical Interval Note:  07/07/2020 10:49 AM  Cheyenne Hernandez  has presented today for surgery, with the diagnosis of FAMILY HX.OF COLON POLYPS (FATHER).  The various methods of treatment have been discussed with the patient and family. After consideration of risks, benefits and other options for treatment, the patient has consented to  Procedure(s) with comments: COLONOSCOPY (N/A) - DM as a surgical intervention.  The patient's history has been reviewed, patient examined, no change in status, stable for surgery.  I have reviewed the patient's chart and labs.  Questions were answered to the patient's satisfaction.     Lesly Rubenstein  Ok to proceed with colonoscopy

## 2020-07-07 NOTE — H&P (Signed)
Outpatient short stay form Pre-procedure 07/07/2020 10:47 AM Cheyenne Miyamoto MD, MPH  Primary Physician: None listed  Reason for visit:  Screening colonoscopy  History of present illness:   54 y/o lady with history of morbid obesity, hypertension, and DM II here for screening colonoscopy. Has family history of polyps but no family history of GI malignancies. Of note she was diagnosed with diverticulitis on 4/01 which is > 4 weeks from now and is pain free. No blood thinners. No new symptoms.    Current Facility-Administered Medications:  .  0.9 %  sodium chloride infusion, , Intravenous, Continuous, Jaeshawn Silvio, Hilton Cork, MD, Last Rate: 20 mL/hr at 07/07/20 1038, New Bag at 07/07/20 1038  Medications Prior to Admission  Medication Sig Dispense Refill Last Dose  . cholecalciferol (VITAMIN D3) 25 MCG (1000 UNIT) tablet Take 1,000 Units by mouth daily.   Past Week at Unknown time  . losartan (COZAAR) 25 MG tablet Take 25 mg by mouth daily.   07/07/2020 at 0800  . metFORMIN (GLUCOPHAGE) 1000 MG tablet Take 1,000 mg by mouth 2 (two) times daily.   Past Week at Unknown time  . Multiple Vitamins-Minerals (MULTIVITAMIN WITH MINERALS) tablet Take 1 tablet by mouth daily.   Past Week at Unknown time  . ondansetron (ZOFRAN ODT) 8 MG disintegrating tablet Take 1 tablet (8 mg total) by mouth every 8 (eight) hours as needed for nausea or vomiting. 12 tablet 0 Past Week at Unknown time  . OZEMPIC, 0.25 OR 0.5 MG/DOSE, 2 MG/1.5ML SOPN Inject 0.5 mg into the skin every Sunday.   Past Week at Unknown time     Allergies  Allergen Reactions  . Other Itching and Swelling    Pt had epidural - was allergic to medication that was used      Past Medical History:  Diagnosis Date  . Anemia   . Complication of anesthesia    epideral allergy  . Depression   . Diabetes mellitus without complication ([place])   . Diverticulitis   . Hypertension   . Morbid obesity ([place])     Review of systems:  Otherwise  negative.    Physical Exam  Gen: Alert, oriented. Appears stated age.  HEENT: PERRLA. Lungs: No respiratory distress CV: RRR Abd: soft, benign, no masses Ext: No edema    Planned procedures: Proceed with colonoscopy. The patient understands the nature of the planned procedure, indications, risks, alternatives and potential complications including but not limited to bleeding, infection, perforation, damage to internal organs and possible oversedation/side effects from anesthesia. The patient agrees and gives consent to proceed.  Please refer to procedure notes for findings, recommendations and patient disposition/instructions.     Cheyenne Miyamoto MD, MPH Gastroenterology 07/07/2020  10:47 AM

## 2020-07-07 NOTE — Op Note (Addendum)
[facility] Gastroenterology Patient Name: Cheyenne Hernandez Procedure Date: 07/07/2020 10:44 AM MRN: [id number] Account #: [id number] Date of Birth: [birth year] Admit Type: Outpatient Age: 55 Room: [facility] ENDO ROOM 3 Gender: Female Note Status: Supervisor Override Procedure:             Colonoscopy Indications:           Screening for colorectal malignant neoplasm, Family                         history of colon polyps Providers:             Andrey Farmer MD, MD Referring MD:          No Local Md, MD (Referring MD) Medicines:             Monitored Anesthesia Care Complications:         No immediate complications. Estimated blood loss:                         Minimal. Procedure:             Pre-Anesthesia Assessment:                        - Prior to the procedure, a History and Physical was                         performed, and patient medications and allergies were                         reviewed. The patient is competent. The risks and                         benefits of the procedure and the sedation options and                         risks were discussed with the patient. All questions                         were answered and informed consent was obtained.                         Patient identification and proposed procedure were                         verified by the physician, the nurse, the anesthetist                         and the technician in the endoscopy suite. Mental                         Status Examination: alert and oriented. Airway                         Examination: normal oropharyngeal airway and neck                         mobility. Respiratory Examination: clear to  auscultation. CV Examination: normal. Prophylactic                         Antibiotics: The patient does not require prophylactic                         antibiotics. Prior Anticoagulants: The patient has                         taken no  previous anticoagulant or antiplatelet                         agents. ASA Grade Assessment: III - A patient with                         severe systemic disease. After reviewing the risks and                         benefits, the patient was deemed in satisfactory                         condition to undergo the procedure. The anesthesia                         plan was to use monitored anesthesia care (MAC).                         Immediately prior to administration of medications,                         the patient was re-assessed for adequacy to receive                         sedatives. The heart rate, respiratory rate, oxygen                         saturations, blood pressure, adequacy of pulmonary                         ventilation, and response to care were monitored                         throughout the procedure. The physical status of the                         patient was re-assessed after the procedure.                        After obtaining informed consent, the colonoscope was                         passed under direct vision. Throughout the procedure,                         the patient's blood pressure, pulse, and oxygen                         saturations were monitored continuously. The  Colonoscope was introduced through the anus with the                         intention of advancing to the cecum. The scope was                         advanced to the ascending colon before the procedure                         was aborted. Medications were given. The colonoscopy                         was aborted due to the difficulty of the procedure.                         Changing the patient to a supine position and applying                         abdominal pressure did not allow for the successful                         completion of the procedure. The quality of the bowel                         preparation was excellent. Findings:      The  perianal and digital rectal examinations were normal.      A 3 mm polyp was found in the transverse colon. The polyp was sessile.       The polyp was removed with a cold snare. Resection and retrieval were       complete. Estimated blood loss was minimal.      A 1 mm polyp was found in the distal descending colon. The polyp was       sessile. The polyp was removed with a jumbo cold forceps. Resection and       retrieval were complete. Estimated blood loss was minimal.      Multiple small-mouthed diverticula were found in the sigmoid colon,       descending colon, splenic flexure, transverse colon, hepatic flexure and       ascending colon.      Internal hemorrhoids were found during retroflexion. The hemorrhoids       were Grade I (internal hemorrhoids that do not prolapse).      The exam was otherwise without abnormality on direct and retroflexion       views. Impression:            - The procedure was aborted due to the difficulty of                         the procedure. Was able to get just outside ileocecal                         valve but not intubate the cecum.                        - One 3 mm polyp in the transverse colon, removed with  a cold snare. Resected and retrieved.                        - One 1 mm polyp in the distal descending colon,                         removed with a jumbo cold forceps. Resected and                         retrieved.                        - Diverticulosis in the sigmoid colon, in the                         descending colon, at the splenic flexure, in the                         transverse colon, at the hepatic flexure and in the                         ascending colon.                        - Internal hemorrhoids.                        - The examination was otherwise normal on direct and                         retroflexion views. Recommendation:        - Discharge patient to home.                        - Resume  previous diet.                        - Continue present medications.                        - Await pathology results.                        - Perform a virtual colonoscopy at appointment to be                         scheduled. Procedure Code(s):     --- Professional ---                        [phone], 52, Colonoscopy, flexible; with removal of                         tumor(s), polyp(s), or other lesion(s) by snare                         technique                        45380, 59,52, Colonoscopy, flexible; with biopsy,  single or multiple Diagnosis Code(s):     --- Professional ---                        Z12.11, Encounter for screening for malignant neoplasm                         of colon                        K64.0, First degree hemorrhoids                        K63.5, Polyp of colon                        K57.30, Diverticulosis of large intestine without                         perforation or abscess without bleeding CPT copyright 2019 [employer]. All rights reserved. The codes documented in this report are preliminary and upon coder review may  be revised to meet current compliance requirements. Andrey Farmer MD, MD 07/07/2020 11:45:12 AM Number of Addenda: 0 Note Initiated On: 07/07/2020 10:44 AM Scope Withdrawal Time: 0 hours 6 minutes 50 seconds  Total Procedure Duration: 0 hours 32 minutes 34 seconds  Estimated Blood Loss:  Estimated blood loss was minimal.      [facility]

## 2020-07-10 ENCOUNTER — Encounter: Payer: Self-pay | Admitting: Gastroenterology

## 2020-07-10 LAB — SURGICAL PATHOLOGY

## 2020-07-10 NOTE — Anesthesia Postprocedure Evaluation (Signed)
Anesthesia Post Note  Patient: Cheyenne Hernandez  Procedure(s) Performed: COLONOSCOPY (N/A )  Patient location during evaluation: PACU Anesthesia Type: General Level of consciousness: awake and alert Pain management: pain level controlled Vital Signs Assessment: post-procedure vital signs reviewed and stable Respiratory status: spontaneous breathing, nonlabored ventilation, respiratory function stable and patient connected to nasal cannula oxygen Cardiovascular status: blood pressure returned to baseline and stable Postop Assessment: no apparent nausea or vomiting Anesthetic complications: no   No complications documented.   Last Vitals:  Vitals:   07/07/20 1208 07/07/20 1218  BP: 126/81 127/80  Pulse: 68 (!) 59  Resp: 14 12  Temp:    SpO2: 100% 100%    Last Pain:  Vitals:   07/09/20 0912  TempSrc:   PainSc: 0-No pain                 Molli Barrows

## 2020-07-17 ENCOUNTER — Other Ambulatory Visit: Payer: Self-pay | Admitting: Gastroenterology

## 2020-07-17 DIAGNOSIS — Z1211 Encounter for screening for malignant neoplasm of colon: Secondary | ICD-10-CM

## 2020-08-16 ENCOUNTER — Ambulatory Visit
Admission: RE | Admit: 2020-08-16 | Discharge: 2020-08-16 | Disposition: A | Discharge status: Home / Self Care | Payer: Managed Care, Other (non HMO) | Source: Ambulatory Visit | Attending: Gastroenterology | Admitting: Gastroenterology

## 2020-08-16 DIAGNOSIS — Z1211 Encounter for screening for malignant neoplasm of colon: Secondary | ICD-10-CM

## 2022-01-02 IMAGING — MG DIGITAL SCREENING BILAT W/ TOMO W/ CAD
6 of 10 series · 6 of 30 positions shown · non-contrast
Comparison: Previous exam(s).

CLINICAL DATA: Screening.

EXAM:
DIGITAL SCREENING BILATERAL MAMMOGRAM WITH TOMO AND CAD

[R MLO synth-2D (1 of 2)]
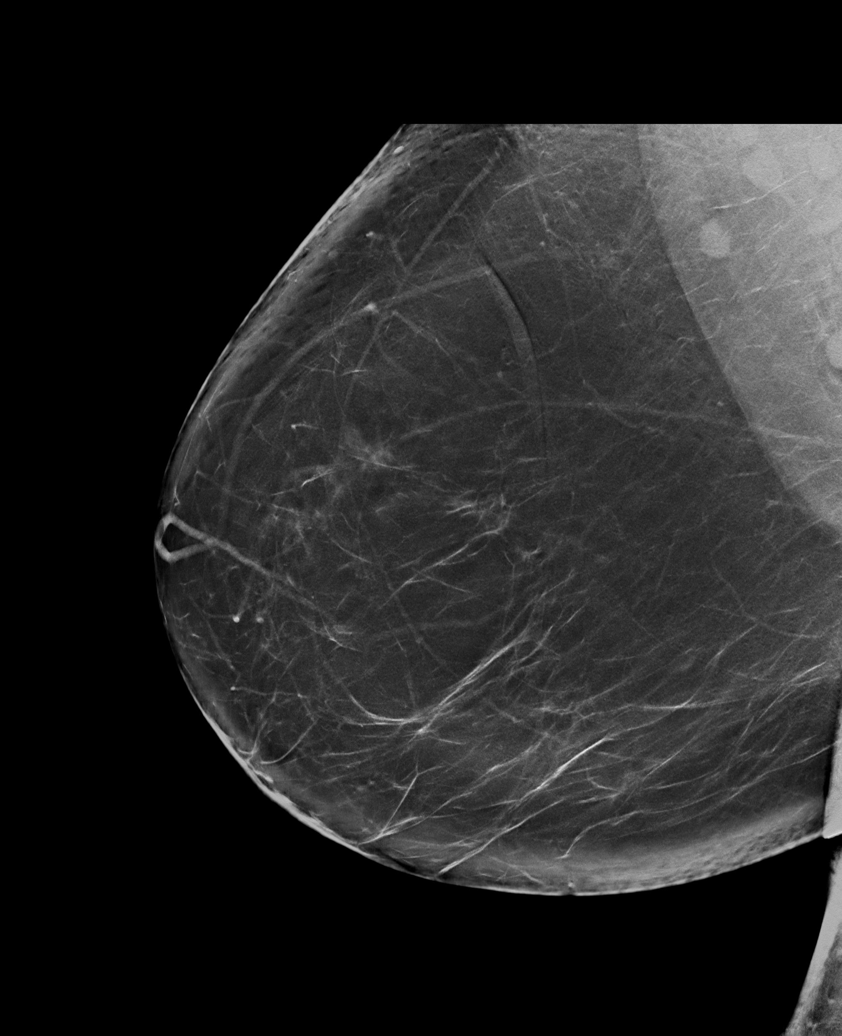

[R CC synth-2D]
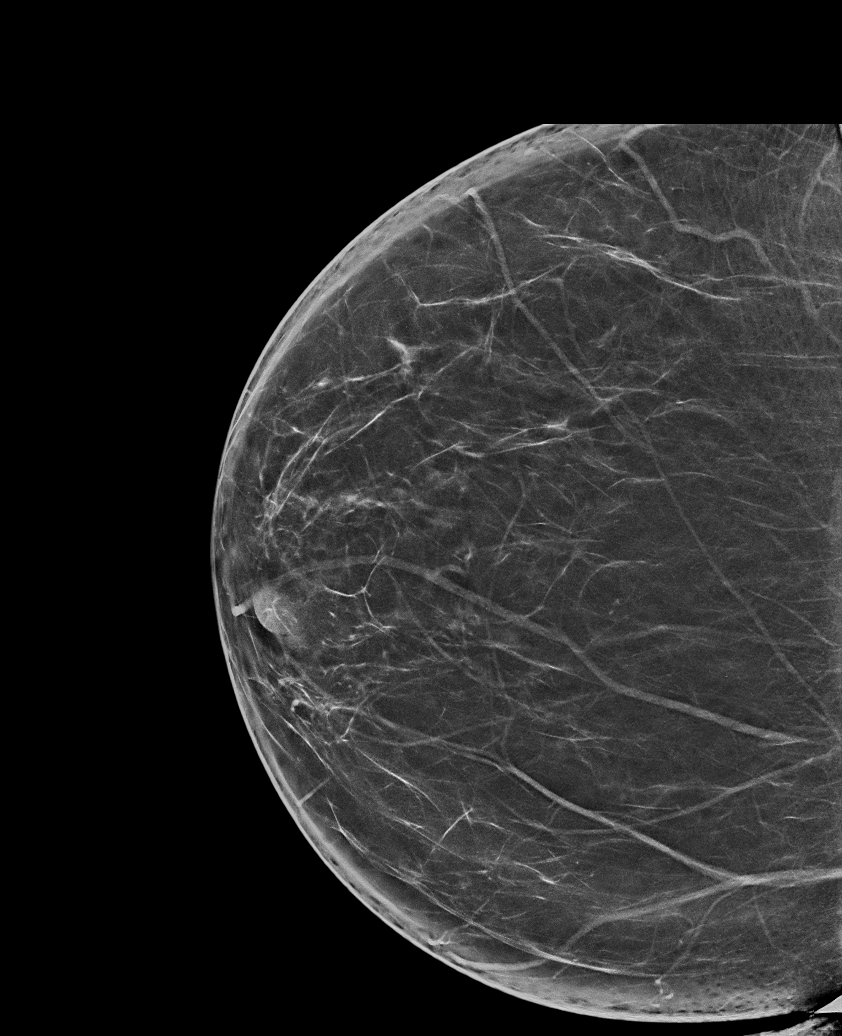

[R MLO synth-2D (2 of 2)]
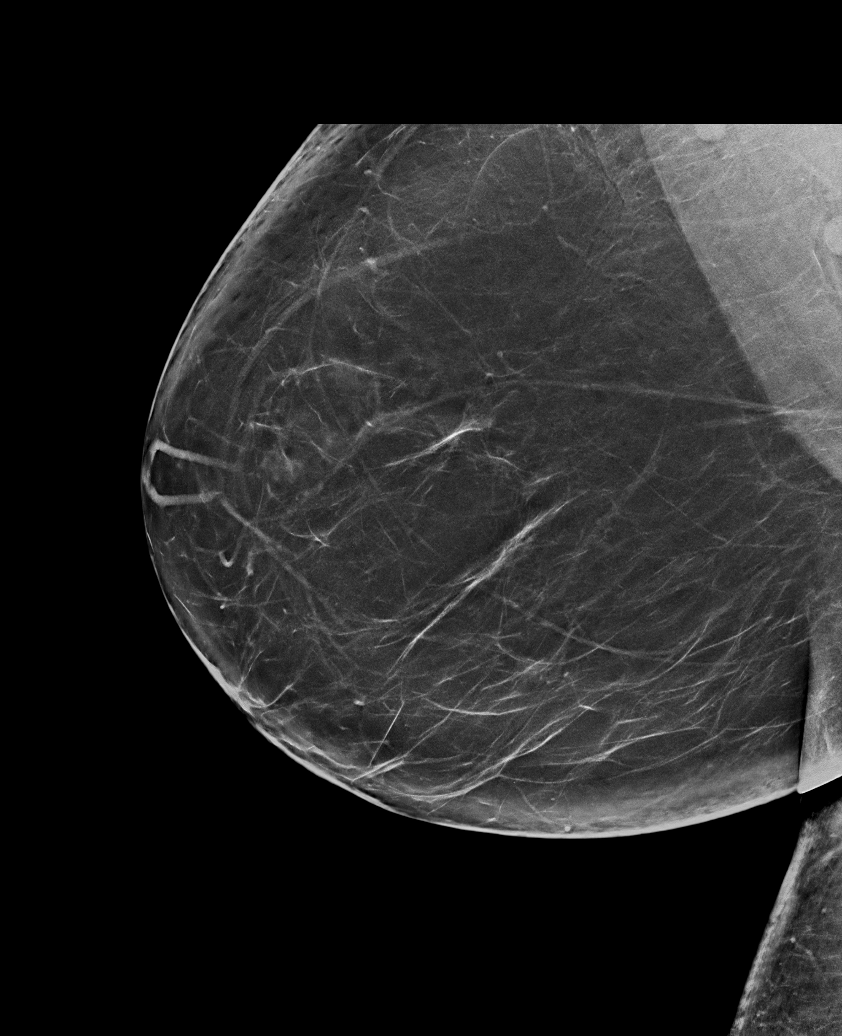

[L CC synth-2D]
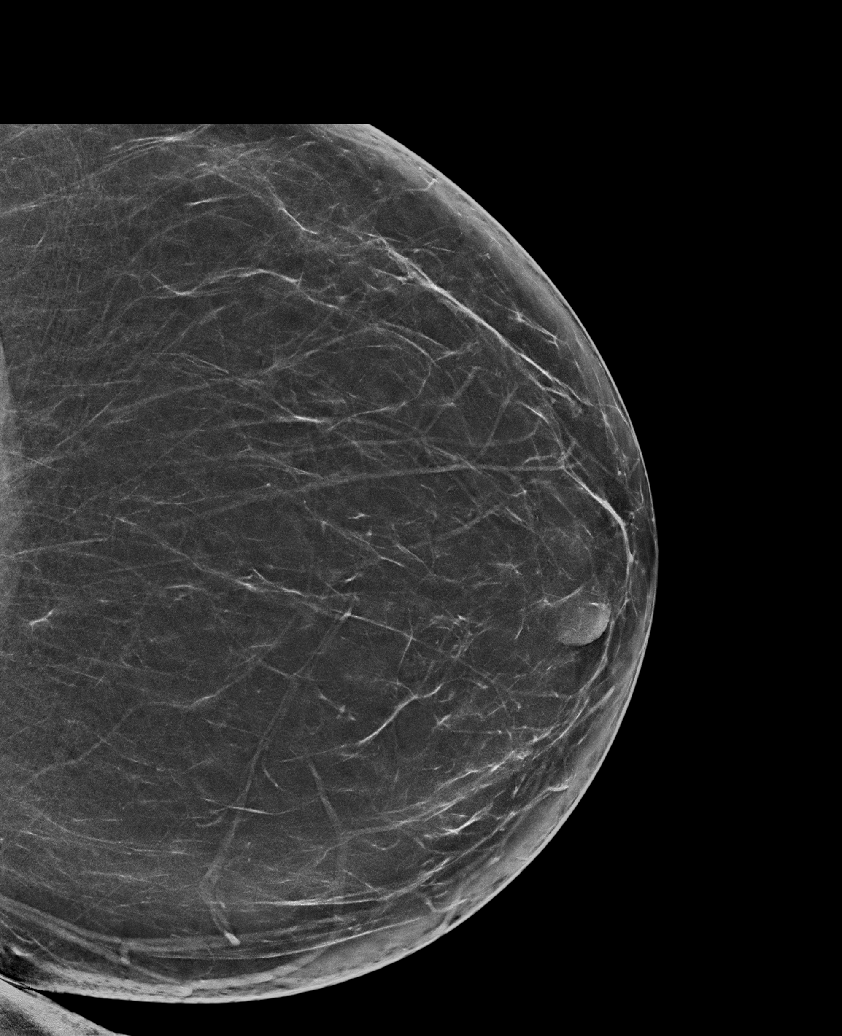

[L MLO synth-2D]
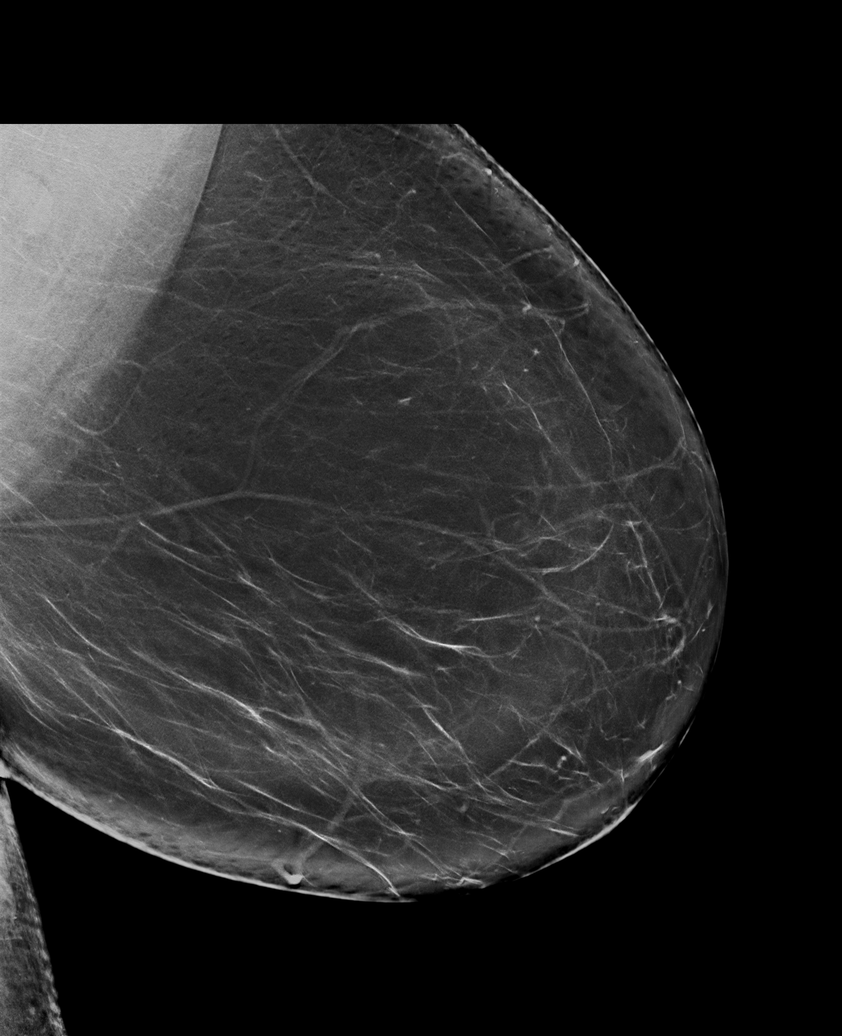

[R MLO tomo · tomo slice 48/95.0]
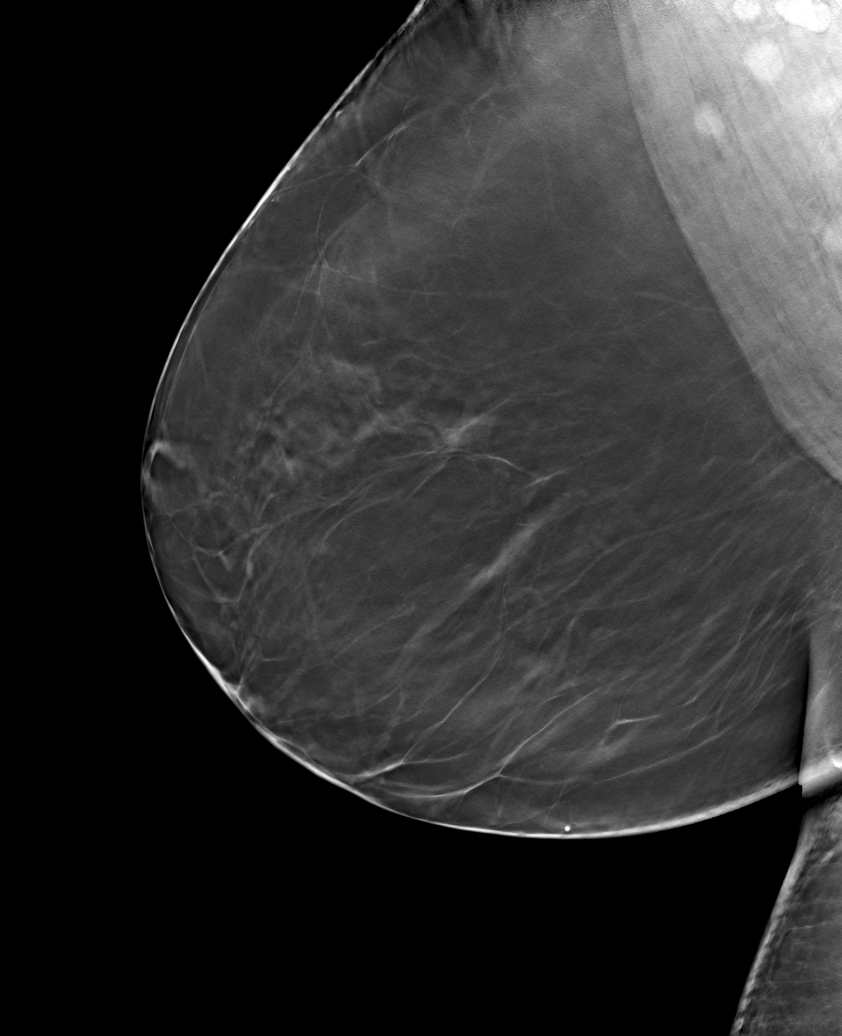

[6 of 30 positions shown; findings below may reference images not displayed]

ACR Breast Density Category b: There are scattered areas of
fibroglandular density.
FINDINGS: There are no findings suspicious for malignancy. Images were
processed with CAD.
IMPRESSION: No mammographic evidence of malignancy. A result letter of this
screening mammogram will be mailed directly to the patient.

RECOMMENDATION:
Screening mammogram in one year. (Code:CN-U-775)

BI-RADS CATEGORY  1: Negative.

## 2022-05-15 ENCOUNTER — Other Ambulatory Visit: Payer: Self-pay

## 2022-05-15 DIAGNOSIS — Z1231 Encounter for screening mammogram for malignant neoplasm of breast: Secondary | ICD-10-CM

## 2022-06-11 ENCOUNTER — Other Ambulatory Visit: Payer: Self-pay | Admitting: Gerontology

## 2022-06-11 DIAGNOSIS — Z1231 Encounter for screening mammogram for malignant neoplasm of breast: Secondary | ICD-10-CM

## 2022-07-02 ENCOUNTER — Ambulatory Visit
Admission: RE | Admit: 2022-07-02 | Discharge: 2022-07-02 | Disposition: A | Discharge status: Home / Self Care | Payer: BC Managed Care – PPO | Source: Ambulatory Visit | Attending: Gerontology | Admitting: Gerontology

## 2022-07-02 DIAGNOSIS — Z1231 Encounter for screening mammogram for malignant neoplasm of breast: Secondary | ICD-10-CM | POA: Insufficient documentation
